# Patient Record
Sex: Female | Born: 1935 | Race: White | Hispanic: No
Health system: Southern US, Community
[De-identification: ages and names within clinical notes are randomized; demographics above are authoritative.]

---

## 2004-07-15 ENCOUNTER — Ambulatory Visit: Payer: Self-pay | Admitting: Urology

## 2004-07-19 ENCOUNTER — Other Ambulatory Visit: Payer: Self-pay

## 2004-07-19 ENCOUNTER — Ambulatory Visit: Payer: Self-pay | Admitting: Urology

## 2004-07-28 ENCOUNTER — Ambulatory Visit: Payer: Self-pay | Admitting: Urology

## 2005-10-16 ENCOUNTER — Inpatient Hospital Stay: Payer: Self-pay | Admitting: Internal Medicine

## 2005-10-16 ENCOUNTER — Other Ambulatory Visit: Payer: Self-pay

## 2007-01-07 ENCOUNTER — Ambulatory Visit: Payer: Self-pay | Admitting: Ophthalmology

## 2007-01-07 ENCOUNTER — Other Ambulatory Visit: Payer: Self-pay

## 2007-01-14 ENCOUNTER — Ambulatory Visit: Payer: Self-pay | Admitting: Ophthalmology

## 2007-04-26 ENCOUNTER — Inpatient Hospital Stay: Payer: Self-pay | Admitting: Internal Medicine

## 2008-09-16 ENCOUNTER — Ambulatory Visit: Payer: Self-pay | Admitting: Family Medicine

## 2010-05-31 ENCOUNTER — Inpatient Hospital Stay: Payer: Self-pay | Admitting: Specialist

## 2010-06-03 ENCOUNTER — Ambulatory Visit: Payer: Self-pay | Admitting: Interventional Cardiology

## 2010-12-10 ENCOUNTER — Emergency Department: Payer: Self-pay | Admitting: Emergency Medicine

## 2012-07-17 ENCOUNTER — Encounter: Payer: Self-pay | Admitting: Nurse Practitioner

## 2012-07-17 ENCOUNTER — Encounter: Payer: Self-pay | Admitting: Cardiothoracic Surgery

## 2013-06-18 ENCOUNTER — Emergency Department: Payer: Self-pay | Admitting: Internal Medicine

## 2013-06-18 LAB — AMMONIA: Ammonia, Plasma: <10 mcmol/L (ref 11–32)

## 2013-06-18 LAB — BASIC METABOLIC PANEL
ANION GAP: 4 — AB (ref 7–16)
BUN: 18 mg/dL (ref 7–18)
CALCIUM: 9.1 mg/dL (ref 8.5–10.1)
CO2: 27 mmol/L (ref 21–32)
Chloride: 106 mmol/L (ref 98–107)
Creatinine: 0.85 mg/dL (ref 0.60–1.30)
EGFR (African American): >60
EGFR (Non-African Amer.): 57 — ABNORMAL LOW
Glucose: 100 mg/dL — ABNORMAL HIGH (ref 65–99)
OSMOLALITY: 276 (ref 275–301)
POTASSIUM: 4.3 mmol/L (ref 3.5–5.1)
SODIUM: 137 mmol/L (ref 136–145)

## 2013-06-18 LAB — SEDIMENTATION RATE: Erythrocyte Sed Rate: 13 mm/hr (ref 0–30)

## 2013-06-18 LAB — CBC
HCT: 45.2 % (ref 35.0–47.0)
HGB: 15.5 g/dL (ref 12.0–16.0)
MCH: 31.6 pg (ref 26.0–34.0)
MCHC: 34.2 g/dL (ref 32.0–36.0)
MCV: 93 fL (ref 80–100)
PLATELETS: 249 10*3/uL (ref 150–440)
RBC: 4.89 10*6/uL (ref 3.80–5.20)
RDW: 13 % (ref 11.5–14.5)
WBC: 6.2 10*3/uL (ref 3.6–11.0)

## 2013-06-18 LAB — HEPATIC FUNCTION PANEL A (ARMC)
Albumin: 3.7 g/dL (ref 3.4–5.0)
Alkaline Phosphatase: 99 U/L
BILIRUBIN DIRECT: 0.1 mg/dL (ref 0.00–0.20)
Bilirubin,Total: 0.5 mg/dL (ref 0.2–1.0)
SGOT(AST): 20 U/L (ref 15–37)
SGPT (ALT): 17 U/L (ref 12–78)
Total Protein: 7.6 g/dL (ref 6.4–8.2)

## 2013-06-18 LAB — URINALYSIS, COMPLETE
BACTERIA: NONE SEEN
BLOOD: NEGATIVE
Bilirubin,UR: NEGATIVE
Glucose,UR: NEGATIVE mg/dL (ref 0–75)
Ketone: NEGATIVE
Leukocyte Esterase: NEGATIVE
NITRITE: NEGATIVE
PH: 6 (ref 4.5–8.0)
Protein: NEGATIVE
Specific Gravity: 1.013 (ref 1.003–1.030)
Squamous Epithelial: 1

## 2013-06-18 LAB — TSH: THYROID STIMULATING HORM: 3.64 u[IU]/mL

## 2013-06-18 LAB — TROPONIN I: Troponin-I: <0.02 ng/mL

## 2013-06-18 LAB — PRO B NATRIURETIC PEPTIDE: B-Type Natriuretic Peptide: 824 pg/mL — ABNORMAL HIGH (ref 0–450)

## 2014-09-01 LAB — BASIC METABOLIC PANEL
Anion Gap: 6 — ABNORMAL LOW (ref 7–16)
BUN: 21 mg/dL — AB
CALCIUM: 8.4 mg/dL — AB
CHLORIDE: 105 mmol/L
Co2: 27 mmol/L
Creatinine: 0.83 mg/dL
EGFR (African American): >60
EGFR (Non-African Amer.): 58 — ABNORMAL LOW
Glucose: 87 mg/dL
Potassium: 3.8 mmol/L
Sodium: 138 mmol/L

## 2014-09-01 LAB — CBC
HCT: 38.9 % — ABNORMAL LOW (ref 40.0–52.0)
HGB: 12.4 g/dL (ref 12.0–16.0)
MCH: 29.9 pg (ref 26.0–34.0)
MCHC: 31.8 g/dL — ABNORMAL LOW (ref 32.0–36.0)
MCV: 94 fL (ref 80–100)
PLATELETS: 240 10*3/uL (ref 150–440)
RBC: 4.14 10*6/uL — AB (ref 4.40–5.90)
RDW: 14.1 % (ref 11.5–14.5)
WBC: 8 10*3/uL (ref 3.8–10.6)

## 2014-09-01 LAB — PROTIME-INR
INR: 1
Prothrombin Time: 12.9 secs

## 2014-09-01 LAB — URINALYSIS, COMPLETE
BILIRUBIN, UR: NEGATIVE
Glucose,UR: NEGATIVE mg/dL (ref 0–75)
Ketone: NEGATIVE
Nitrite: POSITIVE
PH: 6 (ref 4.5–8.0)
Protein: NEGATIVE
Specific Gravity: 1.005 (ref 1.003–1.030)
WBC UR: 36 /HPF (ref 0–5)

## 2014-09-01 LAB — APTT: ACTIVATED PTT: 27.1 s (ref 23.6–35.9)

## 2014-09-02 ENCOUNTER — Inpatient Hospital Stay
Admit: 2014-09-02 | Disposition: A | Discharge status: Skilled Nursing Facility | Payer: Self-pay | Attending: Internal Medicine | Admitting: Internal Medicine

## 2014-09-02 LAB — CBC WITH DIFFERENTIAL/PLATELET
Basophil #: 0.1 10*3/uL (ref 0.0–0.1)
Basophil %: 0.9 %
EOS ABS: 0.3 10*3/uL (ref 0.0–0.7)
Eosinophil %: 4.3 %
HCT: 35.1 % — ABNORMAL LOW (ref 40.0–52.0)
HGB: 11.8 g/dL — ABNORMAL LOW (ref 12.0–16.0)
Lymphocyte #: 1.5 10*3/uL (ref 1.0–3.6)
Lymphocyte %: 24.1 %
MCH: 31.3 pg (ref 26.0–34.0)
MCHC: 33.6 g/dL (ref 32.0–36.0)
MCV: 93 fL (ref 80–100)
MONO ABS: 0.7 10*3/uL (ref 0.2–1.0)
Monocyte %: 10.4 %
Neutrophil #: 3.8 10*3/uL (ref 1.4–6.5)
Neutrophil %: 60.3 %
PLATELETS: 222 10*3/uL (ref 150–440)
RBC: 3.77 10*6/uL — ABNORMAL LOW (ref 4.40–5.90)
RDW: 14.2 % (ref 11.5–14.5)
WBC: 6.3 10*3/uL (ref 3.8–10.6)

## 2014-09-02 LAB — BASIC METABOLIC PANEL
Anion Gap: 7 (ref 7–16)
BUN: 17 mg/dL
CALCIUM: 8.1 mg/dL — AB
CO2: 26 mmol/L
Chloride: 106 mmol/L
Creatinine: 0.79 mg/dL
EGFR (African American): >60
EGFR (Non-African Amer.): >60
Glucose: 92 mg/dL
Potassium: 4.5 mmol/L
Sodium: 139 mmol/L

## 2014-09-03 LAB — URINALYSIS, COMPLETE
Bilirubin,UR: NEGATIVE
GLUCOSE, UR: NEGATIVE mg/dL (ref 0–75)
KETONE: NEGATIVE
Nitrite: NEGATIVE
Ph: 6 (ref 4.5–8.0)
Protein: 100
RBC,UR: 124 /HPF (ref 0–5)
SPECIFIC GRAVITY: 1.012 (ref 1.003–1.030)

## 2014-09-03 LAB — BASIC METABOLIC PANEL
Anion Gap: 5 — ABNORMAL LOW (ref 7–16)
BUN: 21 mg/dL — ABNORMAL HIGH
CHLORIDE: 103 mmol/L
CREATININE: 0.71 mg/dL
Calcium, Total: 8.2 mg/dL — ABNORMAL LOW
Co2: 28 mmol/L
Glucose: 117 mg/dL — ABNORMAL HIGH
POTASSIUM: 4.3 mmol/L
Sodium: 136 mmol/L

## 2014-09-03 LAB — CBC WITH DIFFERENTIAL/PLATELET
BASOS PCT: 1 %
Basophil #: 0.1 10*3/uL (ref 0.0–0.1)
EOS ABS: 0.3 10*3/uL (ref 0.0–0.7)
Eosinophil %: 3.2 %
HCT: 36.9 % — AB (ref 40.0–52.0)
HGB: 12 g/dL (ref 12.0–16.0)
LYMPHS PCT: 18.1 %
Lymphocyte #: 1.7 10*3/uL (ref 1.0–3.6)
MCH: 30.6 pg (ref 26.0–34.0)
MCHC: 32.6 g/dL (ref 32.0–36.0)
MCV: 94 fL (ref 80–100)
MONO ABS: 0.7 10*3/uL (ref 0.2–1.0)
Monocyte %: 7.6 %
Neutrophil #: 6.6 10*3/uL — ABNORMAL HIGH (ref 1.4–6.5)
Neutrophil %: 70.1 %
PLATELETS: 234 10*3/uL (ref 150–440)
RBC: 3.93 10*6/uL — ABNORMAL LOW (ref 4.40–5.90)
RDW: 14.1 % (ref 11.5–14.5)
WBC: 9.4 10*3/uL (ref 3.8–10.6)

## 2014-09-04 LAB — BASIC METABOLIC PANEL
Anion Gap: 7 (ref 7–16)
BUN: 13 mg/dL
CO2: 26 mmol/L
CREATININE: 0.64 mg/dL
Calcium, Total: 8.4 mg/dL — ABNORMAL LOW
Chloride: 105 mmol/L
EGFR (African American): >60
EGFR (Non-African Amer.): >60
Glucose: 102 mg/dL — ABNORMAL HIGH
Potassium: 3.9 mmol/L
Sodium: 138 mmol/L

## 2014-09-04 LAB — CBC WITH DIFFERENTIAL/PLATELET
BASOS PCT: 0.8 %
Basophil #: 0.1 10*3/uL (ref 0.0–0.1)
Eosinophil #: 0.3 10*3/uL (ref 0.0–0.7)
Eosinophil %: 4.5 %
HCT: 37.4 % — ABNORMAL LOW (ref 40.0–52.0)
HGB: 12.3 g/dL (ref 12.0–16.0)
LYMPHS ABS: 1.7 10*3/uL (ref 1.0–3.6)
LYMPHS PCT: 24.8 %
MCH: 30.6 pg (ref 26.0–34.0)
MCHC: 32.9 g/dL (ref 32.0–36.0)
MCV: 93 fL (ref 80–100)
MONOS PCT: 11.6 %
Monocyte #: 0.8 10*3/uL (ref 0.2–1.0)
NEUTROS ABS: 4 10*3/uL (ref 1.4–6.5)
Neutrophil %: 58.3 %
Platelet: 241 10*3/uL (ref 150–440)
RBC: 4.02 10*6/uL — ABNORMAL LOW (ref 4.40–5.90)
RDW: 13.7 % (ref 11.5–14.5)
WBC: 6.9 10*3/uL (ref 3.8–10.6)

## 2014-09-04 LAB — URINE CULTURE

## 2014-09-05 LAB — URINE CULTURE

## 2014-09-08 LAB — CULTURE, BLOOD (SINGLE)

## 2014-10-04 NOTE — Discharge Summary (Signed)
PATIENT NAME:  Melissa Moore MR#:  [id number] DATE OF BIRTH:  [birth year]  DATE OF ADMISSION:  09/02/2014 DATE OF DISCHARGE:  09/05/2014   ADMISSION DIAGNOSES: 1. Fall and left hip pain.  2. Urinary tract infection.   DISCHARGE DIAGNOSES: 1. Left hip arthralgia without any evidence of fracture.  2. Sepsis with fevers and altered mental status. Sepsis was upon admission, secondary to a urinary tract infection.  3. Essential hypertension.  4. Dementia/depression.  5. Non-insulin-dependent diabetes mellitus.   CONSULTATIONS: None.   DISCHARGE PHYSICAL EXAMINATION: VITAL SIGNS: Temperature 97.9, pulse 78, respirations 19, blood pressure 144/71, 96% on room air.  GENERAL: The patient was alert, not oriented to time. She is oriented to her name and place.  RESPIRATORY: Normal respiratory effort. Clear breath sounds. No use of accessory muscles.  CARDIOVASCULAR: Regular rate and rhythm. No murmurs, gallops, or rubs.  EXTREMITIES: No clubbing, cyanosis, or edema.  ABDOMEN: Bowel sounds positive. Nontender, nondistended. No hepatosplenomegaly.   LABORATORIES AT DISCHARGE: White blood cells 6.9, hemoglobin 12.3, hematocrit 38, platelets are 241.   Urine culture. Two small colonies.   Blood cultures: Negative to date.   HOSPITAL COURSE: A very pleasant 81 year old female who was admitted on 09/01/2014 after a fall, subsequently found to have a urinary tract infection. For further details, please refer to H and P.  1. Fall and left hip pain secondary to left hip arthralgia. She underwent an x-ray, which was negative, of her hip, as well as a CT of her hip, which was negative for acute fracture. She has some chronic loosening of her left hip prosthesis. No acute findings. Physical therapy did recommend that the patient go to [facility] for rehabilitation. She has K-Pad placed on her left hip.  2. Sepsis, present on admission. The patient presented with fevers, altered mental status. This is  now resolved. Her source is likely urinary tract infection. Initially, she was on broad-spectrum antibiotics. Initial urine culture growing out Escherichia coli; however, now the urine culture is growing out 2 small colonies. She was on Rocephin, changed to Keflex.  3. Essential hypertension. The patient will continue her home medications.  4. Dementia and depression. The patient is at her baseline.  5. Non-insulin-dependent diabetes mellitus. The patient will continue on her outpatient medications, as well as ADA diet, as tolerated.   DISCHARGE MEDICATIONS: 1. Enalapril 5 mg daily.  2. Amitriptyline 25 mg 2 tablets at bedtime.  3. Aspirin 81 mg daily.  4. Fluticasone 50 mcg 2 sprays daily.  5. Glipizide 5 mg daily.  6. Donepezil 5 mg daily.  7. Remeron 15 mg at bedtime.  8. Norvasc 10 mg daily.  9. Omeprazole 20 mg daily.  10. Zyrtec 10 mg at bedtime.  11. Acetaminophen 325 two tablets q. 4 hours p.r.n. pain.  12. Oxycodone 5 mg 1-2 tablets q. 4 hours p.r.n. moderate pain.  13. Calcium, vitamin D 1 tablet b.i.d.  14. Senna 1 tablet b.i.d. p.r.n.  15. Docusate 100 mg b.i.d.  16. Keflex 250 mg t.i.d. x 3 days.   DISCHARGE DRESSING: DuoDERM q. 5-7 days, change on sacrum, universal sacral decubitus precautions.   DISCHARGE DIET: Low sodium, ADA diet.   DISCHARGE ACTIVITY: As tolerated.   FOLLOWUP: The patient should follow up with her primary care physician in 1-2 weeks.   CONDITION: The patient was stable for discharge.   CODE STATUS: The patient is a full code.   TIME SPENT: Approximately 50 minutes.     ____________________________ Patricia PesaSital  Hilda Lias, MD spm:mw D: 09/05/2014 09:15:25 ET T: 09/05/2014 09:49:16 ET JOB#: [id number]  cc: Sebastiano Luecke P. Juliene Pina, MD, <Dictator> Janyth Contes Rehema Muffley MD ELECTRONICALLY SIGNED 09/05/2014 14:45

## 2014-10-04 NOTE — H&P (Signed)
PATIENT NAME:  Melissa Moore, [name] MR#:  [id number] DATE OF BIRTH:  Oct 21, 1912  DATE OF ADMISSION:  09/01/2014  ADMITTING PHYSICIAN:  Enid Baasadhika Jasmeen Fritsch, MD.   PRIMARY CARE PHYSICIAN: [facility].   CHIEF COMPLAINT: Fall and left hip pain.   HISTORY OF PRESENT ILLNESS: Miss Melissa Moore is a 81 year old pleasant Caucasian female with past medical history significant for dementia, depression, hypertension, non-insulin-dependent diabetes mellitus, who presents from home secondary to a fall onto her left side with left hip pain after the fall. The patient has had prior left hip replacement surgery done. She did have a witnessed fall that seemed more mechanical according to the daughter's description who lives with the patient. The patient has been having limited mobility recently over the last few months, she needs assistance to get to the bedside commode. She does have a walker. She could only walk up to like 15-20 feet at the time with walker and with assistance. Today she was walking with her walker, felt unsteady, and had a fall. Denies any fevers, chills at home. No nausea, vomiting. Since the fall the patient has been complaining of left hip pain and has been unable to move her hip. X-ray here shows significant osteoporosis, no fracture that was seen on the x-rays. Her hip prosthesis appears well seated on the left side. She is being admitted for pain management, CT to rule out any occult fracture.   PAST MEDICAL HISTORY:  1. Hypertension.  2. Non-insulin-dependent diabetes mellitus.  3. Hypothyroidism.  4. Osteoarthritis.  5. Dementia.  6. Depression.   PAST SURGICAL HISTORY:  1. Prior left hip replacement surgery.  2. Left eye intraocular lens implant.  3. Right ankle surgery after a crush injury multiple years ago.  4. Cholecystectomy.  5. Appendectomy.  6. Hysterectomy.   ALLERGIES TO MEDICATIONS: VALIUM.    CURRENT HOME MEDICATIONS:  1. She is on aspirin 81 mg p.o. daily.  2. Amlodipine  10 mg p.o. daily.  3. Amitriptyline 50 mg p.o. at bedtime.  4. Donepezil 5 mg p.o. daily.  5. Enalapril 5 mg p.o. daily.  6. Flonase nasal spray 50 mcg 2 sprays each nostril once a day.  7. Glipizide 5 mg p.o. daily.  8. Remeron 15 mg p.o. at bedtime.  9. Omeprazole 20 mg p.o. daily.  10. Tylenol 1000 mg p.o. b.i.d.  11. Tylenol p.r.n. 500 mg.  12. Zyrtec 10 mg p.o. daily.   SOCIAL HISTORY: Lives at home with her daughter. Has a walker at baseline, but limited mobility recently. Able to recognize family, maintain some conversation, but that has been gradually worsening now. No smoking or alcohol use.   FAMILY HISTORY: Not significant.   REVIEW OF SYSTEMS: Difficult to be obtained secondary to the patient's dementia.   PHYSICAL EXAMINATION:  VITAL SIGNS: Temperature 97.8 degrees Fahrenheit, pulse 58, respirations 25, blood pressure 137/58, pulse oximetry 100% on room air.  GENERAL: Fragile elderly female lying in bed, not in any acute distress.  HEENT: Normocephalic, atraumatic. Multiple erythematous areas noted on the face from frequent scratching. Pupils are equal, round, postsurgical left pupil reacting to light. Anicteric sclerae. Extraocular movements intact. Oropharynx is clear without erythema, mass, or exudates.  NECK: Supple. No thyromegaly, JVD, carotid bruits.  No lymphadenopathy.  LUNGS: Moving air bilaterally. No wheeze or crackles. Decreased bibasilar breath sounds. No use of accessory muscles for breathing.  CARDIOVASCULAR: S1, S2, regular rate and rhythm. A 3/6 systolic murmur present. No rubs or gallops.  ABDOMEN: Soft, benign, nontender, nontender,  nondistended.  No hepatosplenomegaly. Normal bowel sounds.  EXTREMITIES: Feeble pedal pulses palpable bilaterally, right ankle slightly contracted from previous surgery.  No clubbing or cyanosis. Left leg is abducted and externally rotated, but the patient is still able to move the leg with significant pain.  SKIN: No acne,  rash, or lesions.  LYMPHATICS: No cervical lymphadenopathy.  NEUROLOGIC: Cranial nerves seem to be intact. Able to move all 3 extremities other than the fractured leg, and follows commands. Sensation is intact.   PSYCHOLOGICAL: The patient is awake, alert,, but not completely oriented, oriented x 1 still.   LABORATORY DATA:  1.  WBC 8.0, hemoglobin 12.4, hematocrit 38.9, platelet count 240,000.  2.  Sodium 138, potassium 3.8, chloride 105, bicarbonate 27, BUN 21, creatinine 0.83, glucose 87, calcium of 8.4.  3.  INR is 1.0, PTT is 27.1.  4.  Urinalysis was nitrite positive, 3 + leukocyte esterase, 36 WBCs, and 1 + bacteria,  5.  She had left hip x-ray showing left hip prosthesis appears well seated, no demonstrable acute fracture or dislocation. Moderate narrowing of right hip joint.  Bones are diffusely osteoporotic.  The degree of osteoporosis could mask a subtle nondisplaced fracture.   6.  EKG is pending at this time.   ASSESSMENT AND PLAN: 81 year old female with hypertension, diabetes, dementia, depression, admitted after fall and left hip pain.   1.  Fall and left hip pain. Could be pain from the fall with muscular involvement versus occult fracture which is nondisplaced and not being seen on the x-ray. CT has been ordered. We will admit for pain management and physical therapy. The patient does have significant osteoporosis. 2.  Urinary tract infection. Cultures have been requested. Started on Rocephin.   3.  Hypertension. Continue her home medications.  4.  Dementia, depression. Continue her Remeron and amitriptyline.   5.  Noninsulin-dependent diabetes mellitus. Glipizide and sliding scale insulin.  6.  Deep vein thrombosis prophylaxis.  For now TEDs and SCDs. Follow up on the CT, if that is negative we will place her on subcutaneous heparin.   CODE STATUS OF THE PATIENT: Full code.   TOTAL TIME SPENT ON ADMISSION:  50 minutes.     ____________________________ Enid Baas, MD rk:bu D: 09/01/2014 15:20:57 ET T: 09/01/2014 16:01:01 ET JOB#: [id number]  cc: Enid Baas, MD, <Dictator> [facility] Enid Baas MD ELECTRONICALLY SIGNED 09/10/2014 12:32

## 2015-08-18 IMAGING — CT CT OF THE LEFT HIP WITHOUT CONTRAST
2 of 3 series · 14 of 42 positions shown, 19 images · non-contrast
Comparison: Radiographs 09/01/2014.  06/18/2013.

CLINICAL DATA: Fall.  LEFT hip pain.  Osteoporosis.

EXAM:
CT OF THE LEFT HIP WITHOUT CONTRAST
TECHNIQUE: Multidetector CT imaging of the left hip was performed according to
the standard protocol. Multiplanar CT image reconstructions were
also generated.

[Series 6: soft tissue axials · axial · 0.43mm/px · z∈[-432,-256]mm · 11 of 105 slices shown, 16 images]
[im 9/105  soft-tissue]
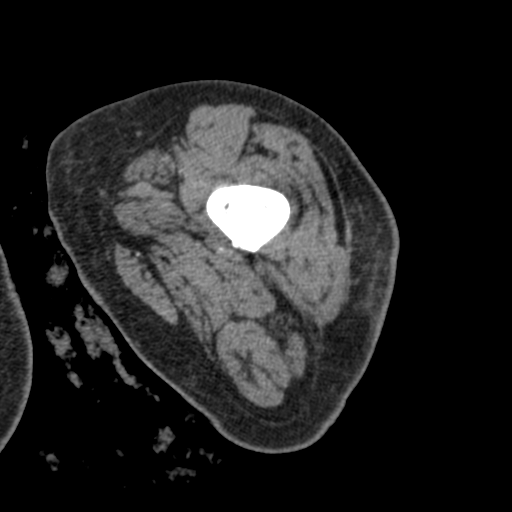
[im 9/105  bone]
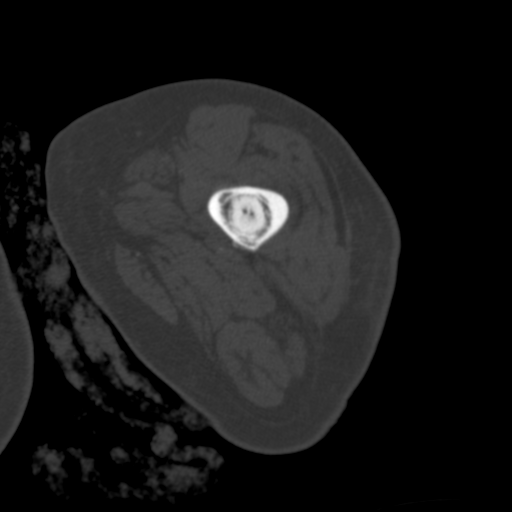
[im 17/105  soft-tissue]
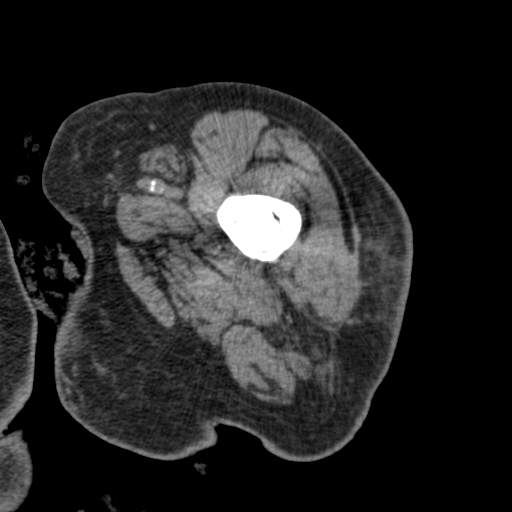
[im 33/105  soft-tissue]
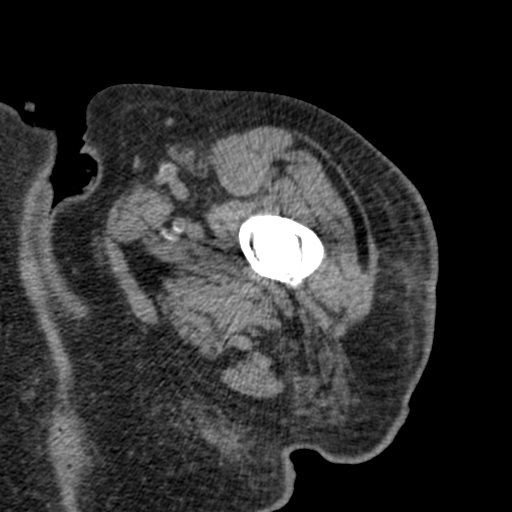
[im 41/105  soft-tissue]
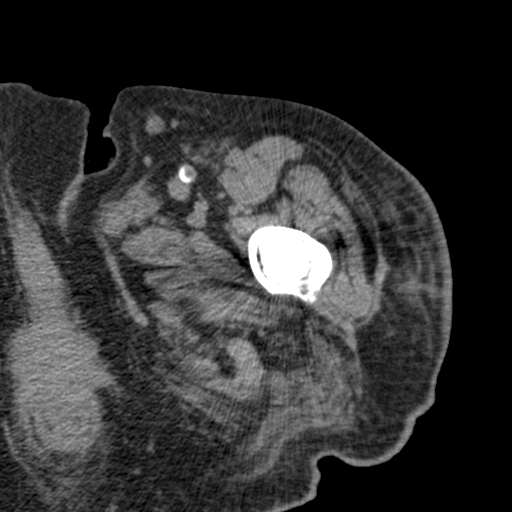
[im 49/105  soft-tissue]
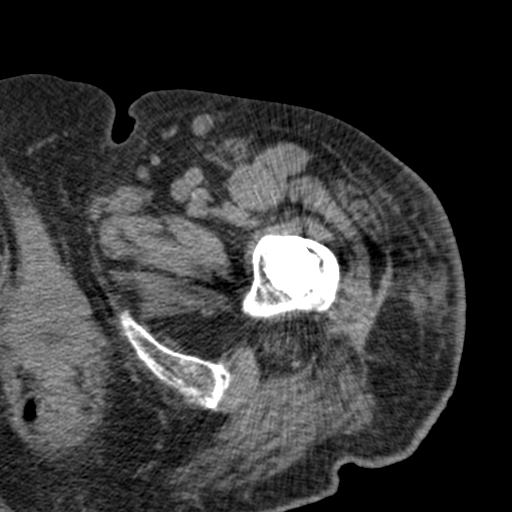
[im 57/105  soft-tissue]
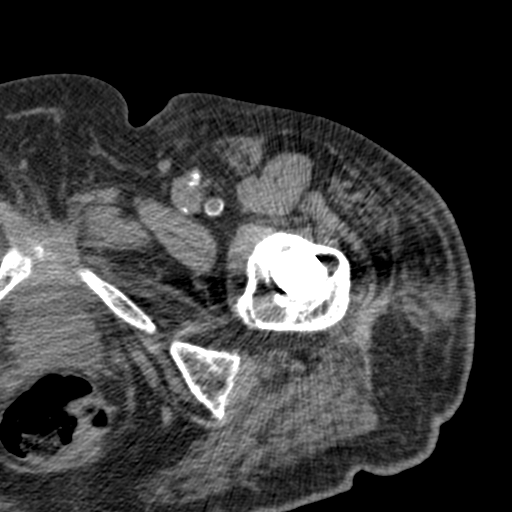
[im 65/105  soft-tissue]
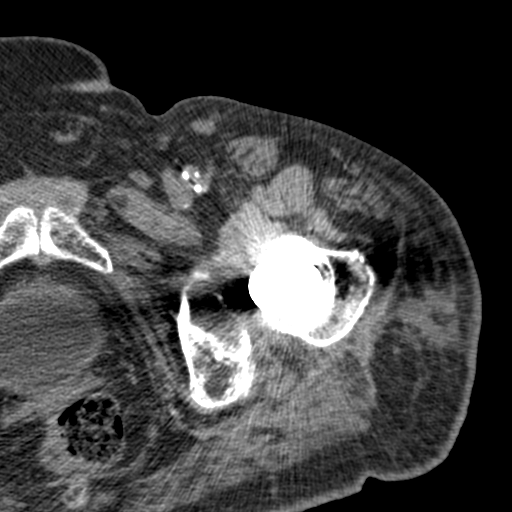
[im 73/105  lung]
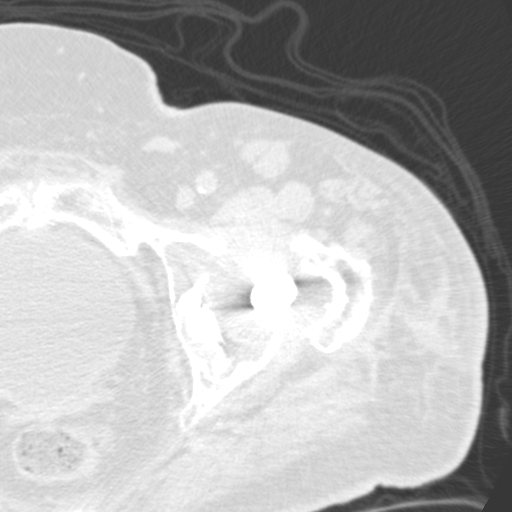
[im 81/105  soft-tissue]
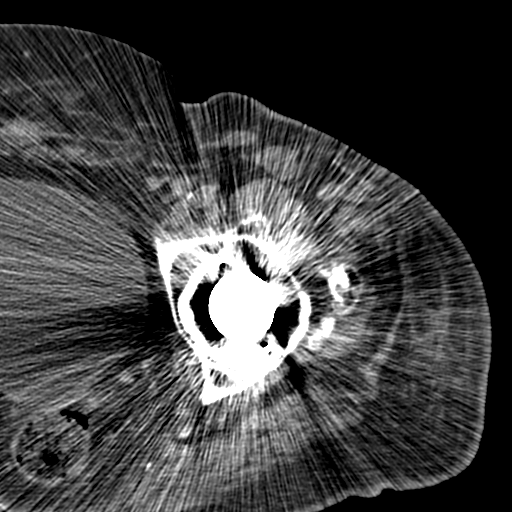
[im 81/105  lung]
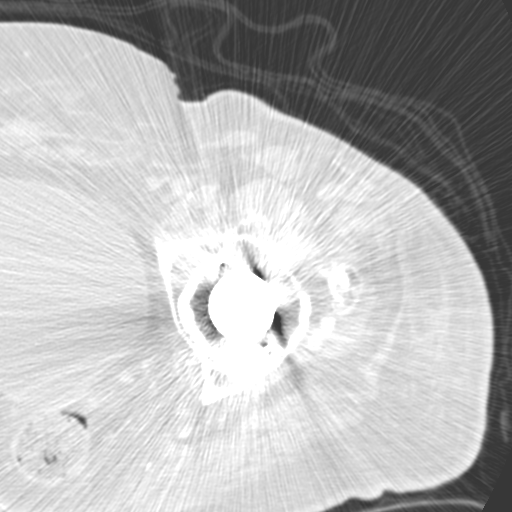
[im 89/105  soft-tissue]
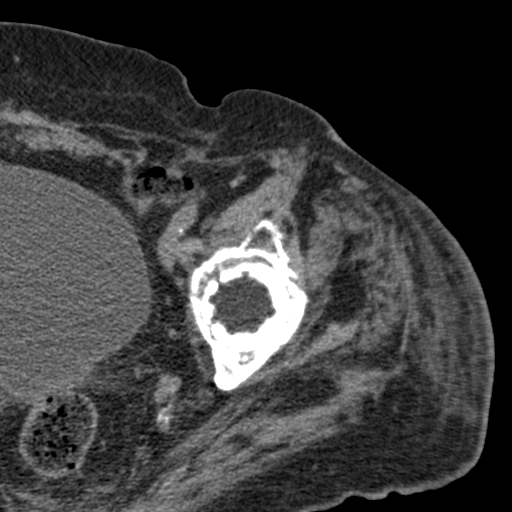
[im 89/105  lung]
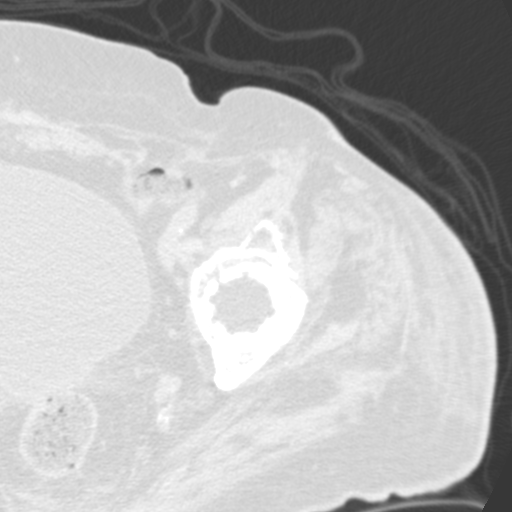
[im 89/105  bone]
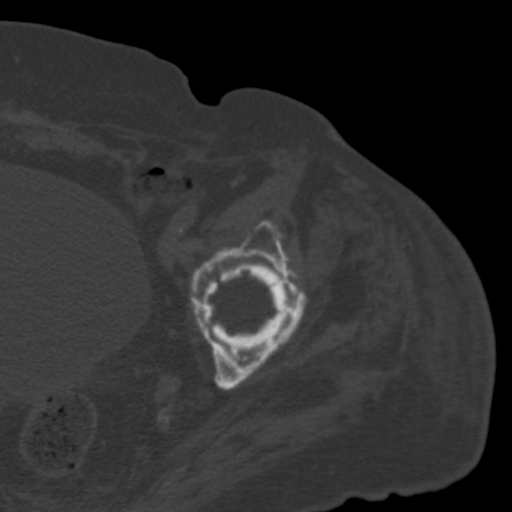
[im 97/105  soft-tissue]
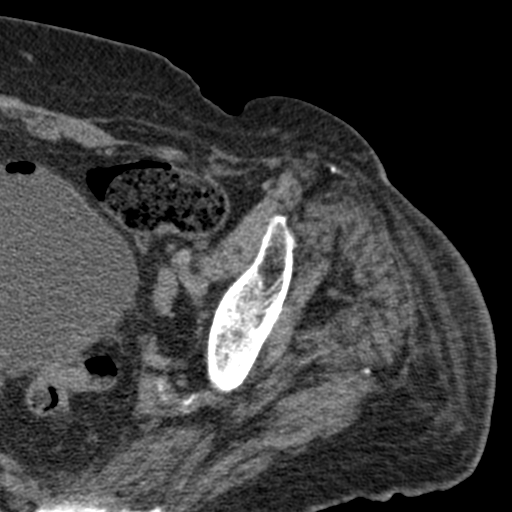
[im 97/105  lung]
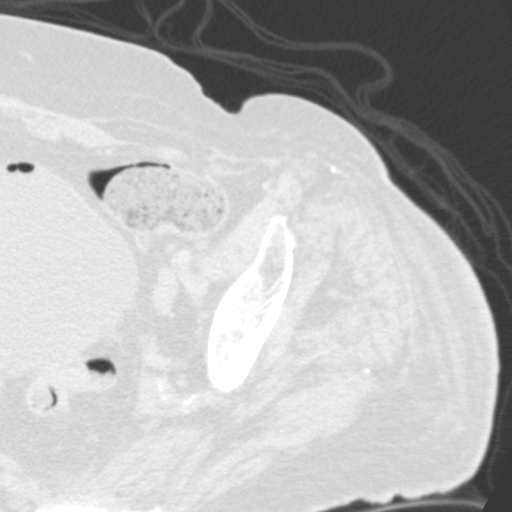

[Series 7: coronal bone · coronal · 0.58mm/px · 3 of 106 slices shown]
[im 36/106  soft-tissue]
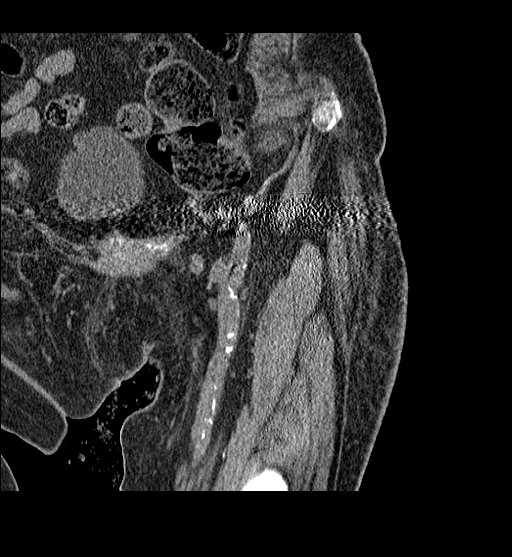
[im 47/106  soft-tissue]
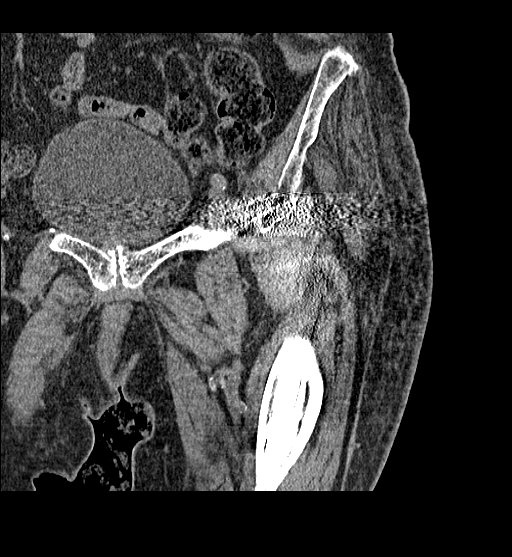
[im 59/106  soft-tissue]
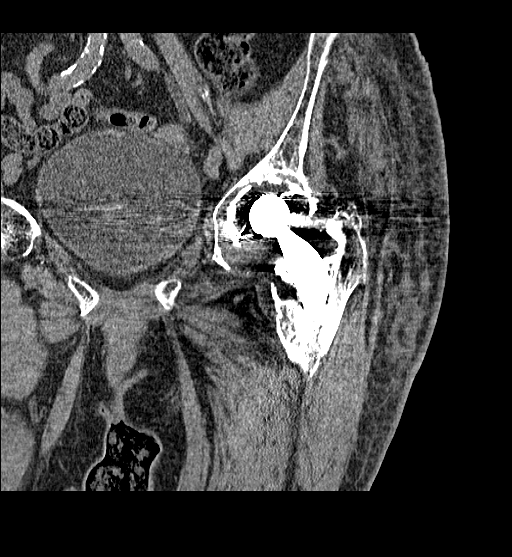

[14 of 42 positions shown; findings below may reference images not displayed]

FINDINGS: There is extensive lucency around the polyethylene acetabular cup
for the LEFT total hip arthroplasty compatible with loosening. The
differential considerations include chronic indolent infection,
particle disease or chronic indolent infection. LEFT acetabula
protrusio is present. Visible portions of the LEFT sacrum appear
normal. LEFT SI joint osteoarthritis. LEFT obturator ring appears
intact. No periprosthetic fracture is identified in the LEFT femoral
shaft. There is lucency around the proximal LEFT femur with erosion
of the calcar. The erosive changes around the LEFT total hip
arthroplasty are chronic. Pubic symphysis degenerative disease is
present.
IMPRESSION: 1. No acute osseous abnormality.
2. Chronic loosening of the femoral and acetabular components of the
LEFT total hip arthroplasty. Differential considerations are aseptic
mechanical loosening, particle disease or chronic indolent
infection.

## 2015-08-20 IMAGING — CR DG CHEST 2V
1 series · 3 of 3 positions shown · non-contrast
Comparison: 06/18/2013.

CLINICAL DATA: Fever.  Hypoxia. dementia; combative

EXAM:
CHEST  2 VIEW

[Series 1: dxr chest pa (or ap) and lateral · 0.14mm/px · 3 of 3 slices shown]
[im 1/3]
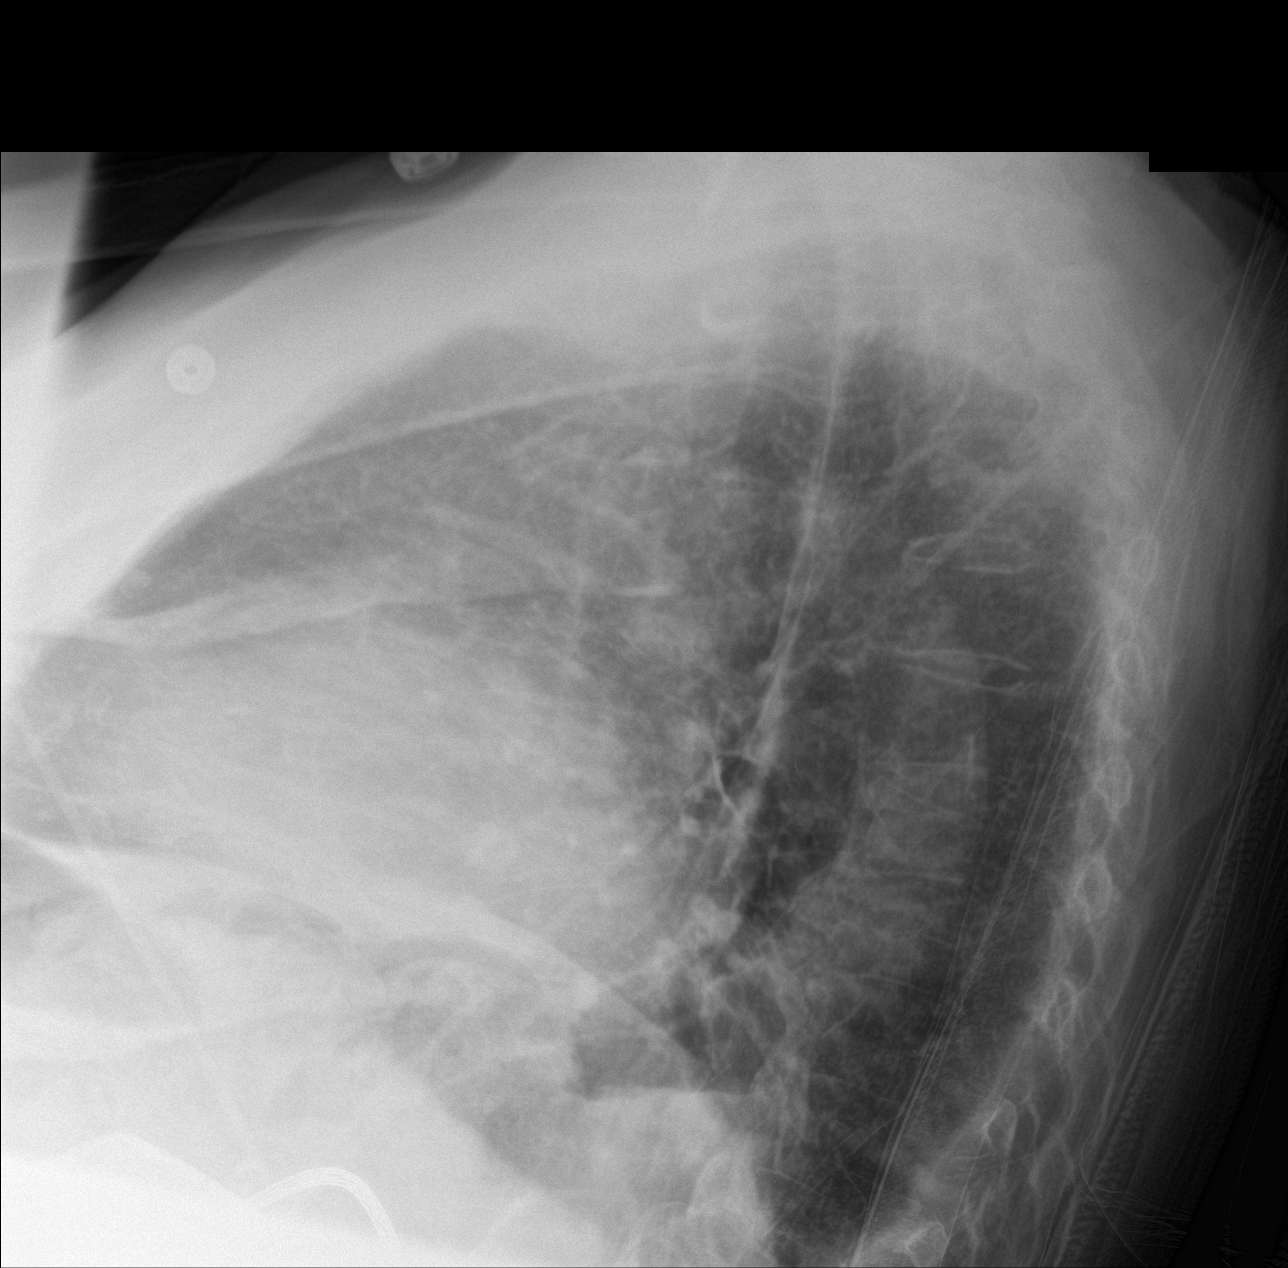
[im 2/3]
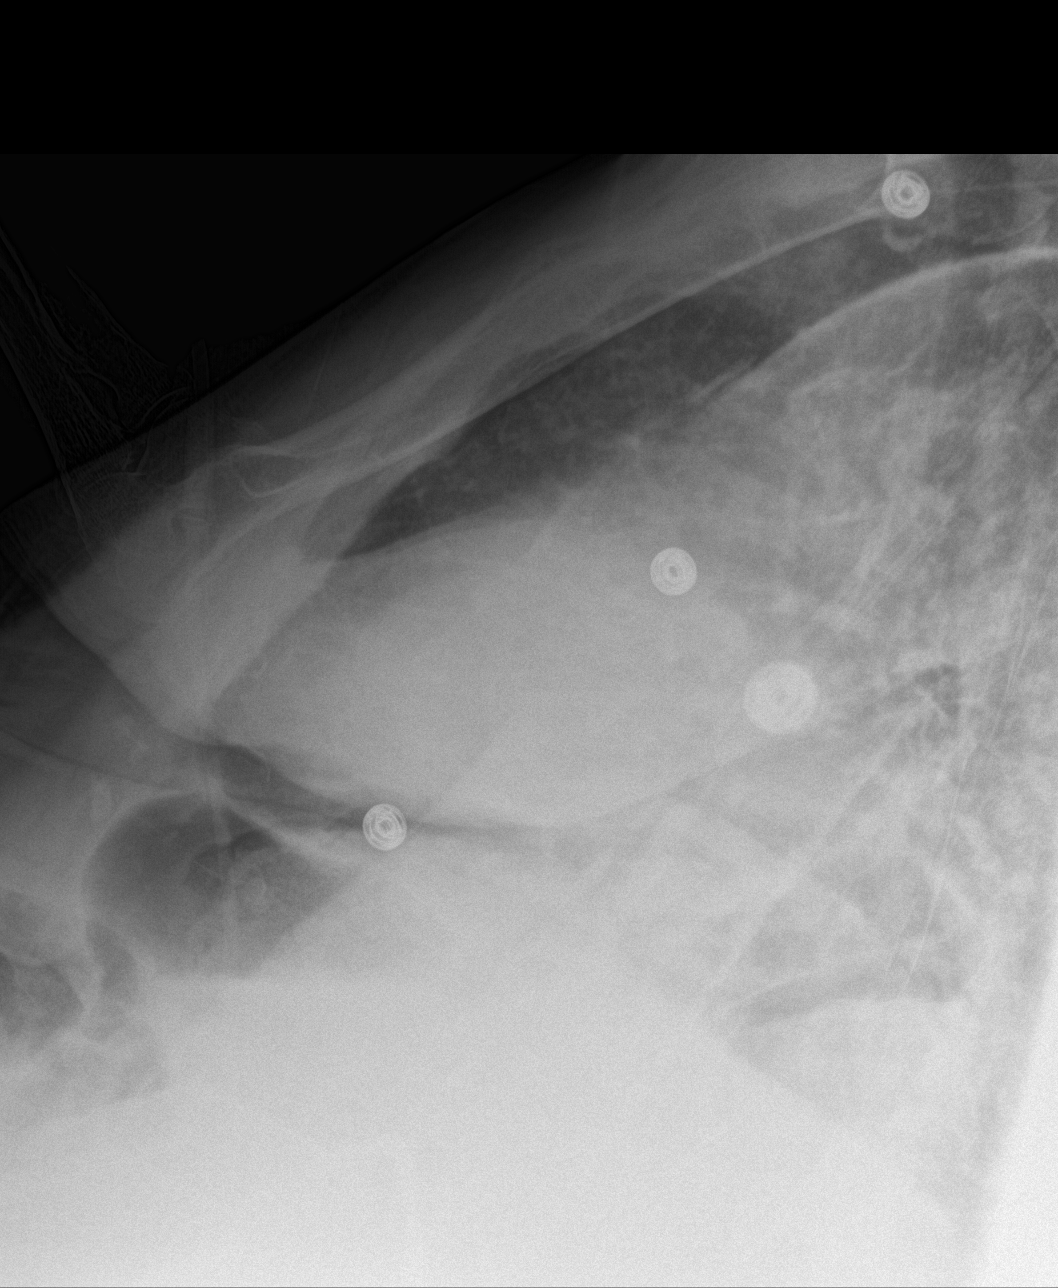
[im 3/3]
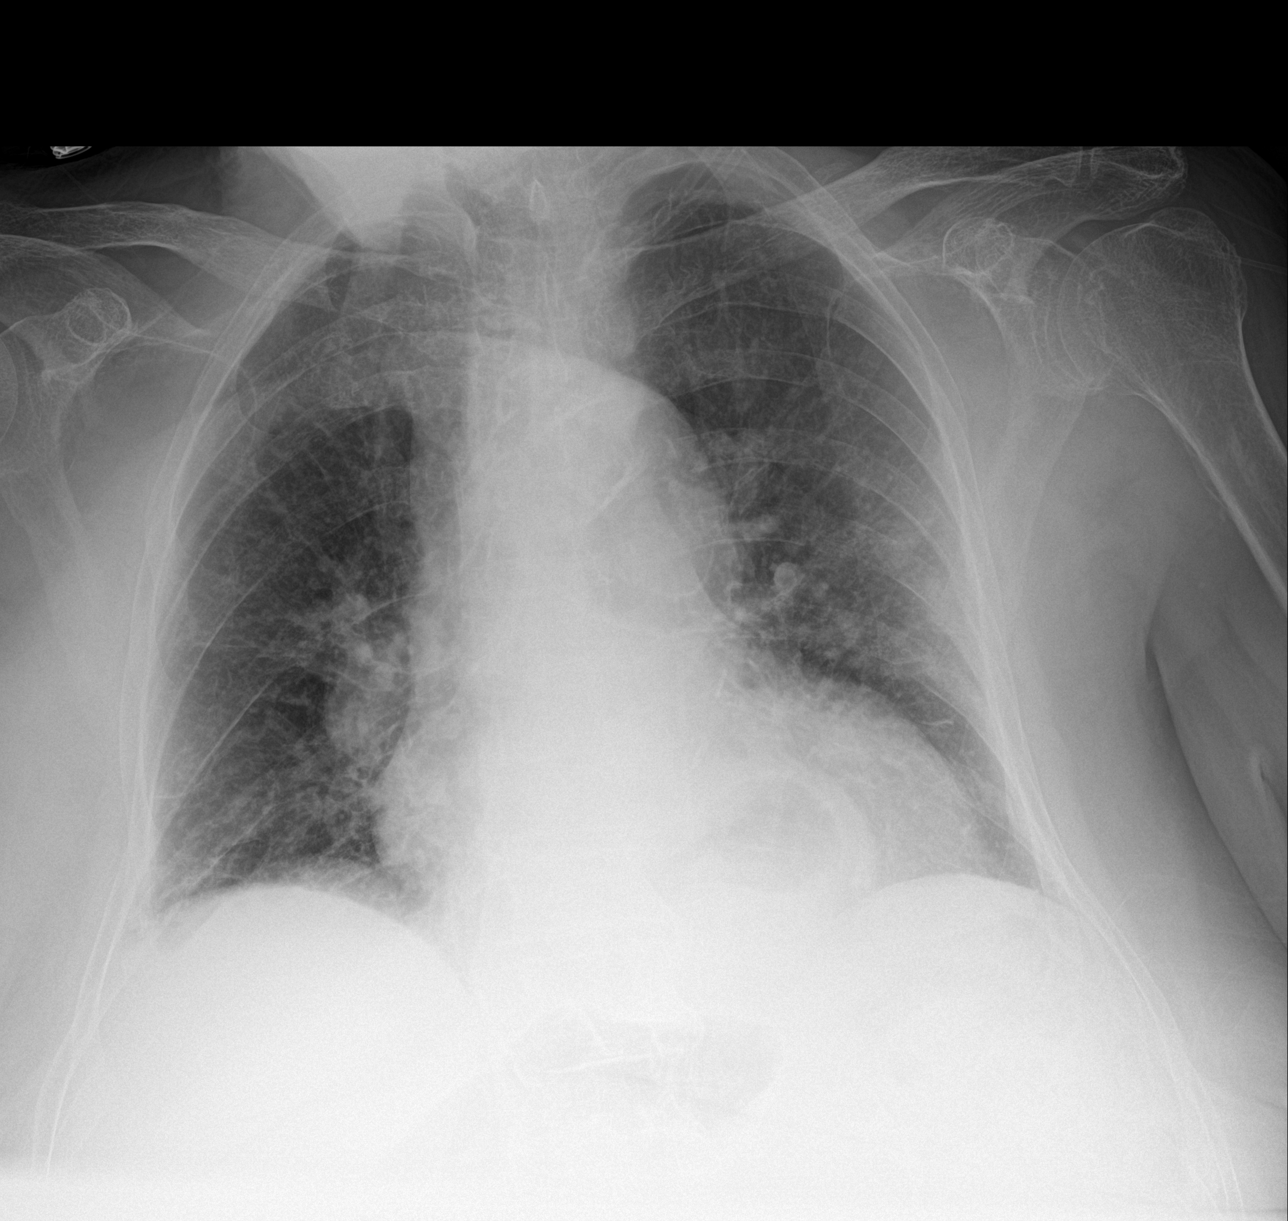

[3 of 3 positions shown; findings below may reference images not displayed]

FINDINGS: The cardiopericardial silhouette is enlarged. Hiatal hernia is
present overlying the cardiopericardial silhouette. There is
enlargement of the pulmonary hila bilaterally, probably due to
pulmonary vascular congestion. Interstitial opacity is present
bilaterally, slightly greater on the RIGHT when compared to the
LEFT. This is most compatible with CHF and interstitial pulmonary
edema. There is no pleural effusion identified. Costophrenic angles
are difficult to visualize due to suboptimal the lateral view.
IMPRESSION: Constellation of findings compatible with mild CHF.

## 2016-03-05 DEATH — deceased
